# Patient Record
Sex: Female | Born: 1970 | Race: White | Hispanic: No | Marital: Married
Health system: Southern US, Community
[De-identification: ages and names within clinical notes are randomized; demographics above are authoritative.]

---

## 2004-08-21 ENCOUNTER — Ambulatory Visit: Payer: Self-pay | Admitting: Family Medicine

## 2004-10-10 ENCOUNTER — Ambulatory Visit: Payer: Self-pay | Admitting: Family Medicine

## 2004-11-18 ENCOUNTER — Ambulatory Visit: Payer: Self-pay | Admitting: Family Medicine

## 2004-11-26 ENCOUNTER — Encounter: Admission: RE | Admit: 2004-11-26 | Discharge: 2005-01-30 | Payer: Self-pay | Admitting: Family Medicine

## 2005-05-08 ENCOUNTER — Ambulatory Visit: Payer: Self-pay | Admitting: Family Medicine

## 2005-05-13 ENCOUNTER — Ambulatory Visit: Payer: Self-pay | Admitting: Family Medicine

## 2005-08-04 ENCOUNTER — Ambulatory Visit: Payer: Self-pay | Admitting: Family Medicine

## 2006-09-14 ENCOUNTER — Ambulatory Visit: Payer: Self-pay | Admitting: Family Medicine

## 2010-06-18 ENCOUNTER — Other Ambulatory Visit (HOSPITAL_COMMUNITY): Payer: Self-pay | Admitting: Physician Assistant

## 2010-06-18 ENCOUNTER — Other Ambulatory Visit (HOSPITAL_COMMUNITY): Payer: Self-pay | Admitting: Obstetrics and Gynecology

## 2010-06-18 DIAGNOSIS — O09519 Supervision of elderly primigravida, unspecified trimester: Secondary | ICD-10-CM

## 2010-06-18 DIAGNOSIS — O24919 Unspecified diabetes mellitus in pregnancy, unspecified trimester: Secondary | ICD-10-CM

## 2010-06-25 ENCOUNTER — Ambulatory Visit (HOSPITAL_COMMUNITY)
Admission: RE | Admit: 2010-06-25 | Discharge: 2010-06-25 | Disposition: A | Discharge status: Home / Self Care | Payer: Medicaid Other | Source: Ambulatory Visit | Attending: Obstetrics and Gynecology | Admitting: Obstetrics and Gynecology

## 2010-06-25 ENCOUNTER — Ambulatory Visit (HOSPITAL_COMMUNITY): Payer: Medicaid Other

## 2010-06-25 ENCOUNTER — Other Ambulatory Visit (HOSPITAL_COMMUNITY): Payer: Self-pay | Admitting: Physician Assistant

## 2010-06-25 DIAGNOSIS — O24919 Unspecified diabetes mellitus in pregnancy, unspecified trimester: Secondary | ICD-10-CM

## 2010-06-25 DIAGNOSIS — O09519 Supervision of elderly primigravida, unspecified trimester: Secondary | ICD-10-CM

## 2010-06-25 DIAGNOSIS — O09529 Supervision of elderly multigravida, unspecified trimester: Secondary | ICD-10-CM | POA: Insufficient documentation

## 2010-07-23 ENCOUNTER — Ambulatory Visit (HOSPITAL_COMMUNITY)
Admission: RE | Admit: 2010-07-23 | Discharge: 2010-07-23 | Disposition: A | Discharge status: Home / Self Care | Payer: Medicaid Other | Source: Ambulatory Visit | Attending: Obstetrics and Gynecology | Admitting: Obstetrics and Gynecology

## 2010-07-23 ENCOUNTER — Other Ambulatory Visit (HOSPITAL_COMMUNITY): Payer: Self-pay | Admitting: Physician Assistant

## 2010-07-23 DIAGNOSIS — O24919 Unspecified diabetes mellitus in pregnancy, unspecified trimester: Secondary | ICD-10-CM

## 2010-07-23 DIAGNOSIS — O09519 Supervision of elderly primigravida, unspecified trimester: Secondary | ICD-10-CM

## 2010-07-23 DIAGNOSIS — O09529 Supervision of elderly multigravida, unspecified trimester: Secondary | ICD-10-CM | POA: Insufficient documentation

## 2010-07-23 DIAGNOSIS — O34219 Maternal care for unspecified type scar from previous cesarean delivery: Secondary | ICD-10-CM | POA: Insufficient documentation

## 2010-08-23 ENCOUNTER — Other Ambulatory Visit (HOSPITAL_COMMUNITY): Payer: Self-pay | Admitting: Obstetrics and Gynecology

## 2010-08-23 ENCOUNTER — Ambulatory Visit (HOSPITAL_COMMUNITY)
Admission: RE | Admit: 2010-08-23 | Discharge: 2010-08-23 | Disposition: A | Discharge status: Home / Self Care | Payer: Medicaid Other | Source: Ambulatory Visit | Attending: Obstetrics and Gynecology | Admitting: Obstetrics and Gynecology

## 2010-08-23 DIAGNOSIS — O09529 Supervision of elderly multigravida, unspecified trimester: Secondary | ICD-10-CM | POA: Insufficient documentation

## 2010-08-23 DIAGNOSIS — O24919 Unspecified diabetes mellitus in pregnancy, unspecified trimester: Secondary | ICD-10-CM | POA: Insufficient documentation

## 2010-08-23 DIAGNOSIS — O09519 Supervision of elderly primigravida, unspecified trimester: Secondary | ICD-10-CM

## 2010-08-23 DIAGNOSIS — O34219 Maternal care for unspecified type scar from previous cesarean delivery: Secondary | ICD-10-CM | POA: Insufficient documentation

## 2010-09-23 ENCOUNTER — Ambulatory Visit (HOSPITAL_COMMUNITY): Payer: Medicaid Other

## 2010-09-27 ENCOUNTER — Ambulatory Visit (HOSPITAL_COMMUNITY)
Admission: RE | Admit: 2010-09-27 | Discharge: 2010-09-27 | Disposition: A | Discharge status: Home / Self Care | Payer: Medicaid Other | Source: Ambulatory Visit | Attending: Obstetrics and Gynecology | Admitting: Obstetrics and Gynecology

## 2010-09-27 ENCOUNTER — Other Ambulatory Visit (HOSPITAL_COMMUNITY): Payer: Self-pay | Admitting: Obstetrics and Gynecology

## 2010-09-27 DIAGNOSIS — O24919 Unspecified diabetes mellitus in pregnancy, unspecified trimester: Secondary | ICD-10-CM | POA: Insufficient documentation

## 2010-09-27 DIAGNOSIS — O09529 Supervision of elderly multigravida, unspecified trimester: Secondary | ICD-10-CM | POA: Insufficient documentation

## 2010-09-27 DIAGNOSIS — O34219 Maternal care for unspecified type scar from previous cesarean delivery: Secondary | ICD-10-CM | POA: Insufficient documentation

## 2010-09-30 ENCOUNTER — Other Ambulatory Visit (HOSPITAL_COMMUNITY): Payer: Self-pay | Admitting: Obstetrics and Gynecology

## 2010-09-30 DIAGNOSIS — O09529 Supervision of elderly multigravida, unspecified trimester: Secondary | ICD-10-CM

## 2010-10-25 ENCOUNTER — Ambulatory Visit (HOSPITAL_COMMUNITY)
Admission: RE | Admit: 2010-10-25 | Discharge: 2010-10-25 | Disposition: A | Discharge status: Home / Self Care | Payer: Medicaid Other | Source: Ambulatory Visit | Attending: Obstetrics and Gynecology | Admitting: Obstetrics and Gynecology

## 2010-10-25 DIAGNOSIS — O34219 Maternal care for unspecified type scar from previous cesarean delivery: Secondary | ICD-10-CM | POA: Insufficient documentation

## 2010-10-25 DIAGNOSIS — O24919 Unspecified diabetes mellitus in pregnancy, unspecified trimester: Secondary | ICD-10-CM | POA: Insufficient documentation

## 2010-10-25 DIAGNOSIS — O09529 Supervision of elderly multigravida, unspecified trimester: Secondary | ICD-10-CM | POA: Insufficient documentation

## 2010-10-25 NOTE — Progress Notes (Signed)
Report in [facility]; follow-up as needed 

## 2013-03-03 IMAGING — US US OB FOLLOW-UP
1 series · 14 of 28 positions shown · non-contrast
Comparison: none

[Series 1: us ob follow-up · 0.21mm/px · 14 of 50 slices shown]
[im 2/50]
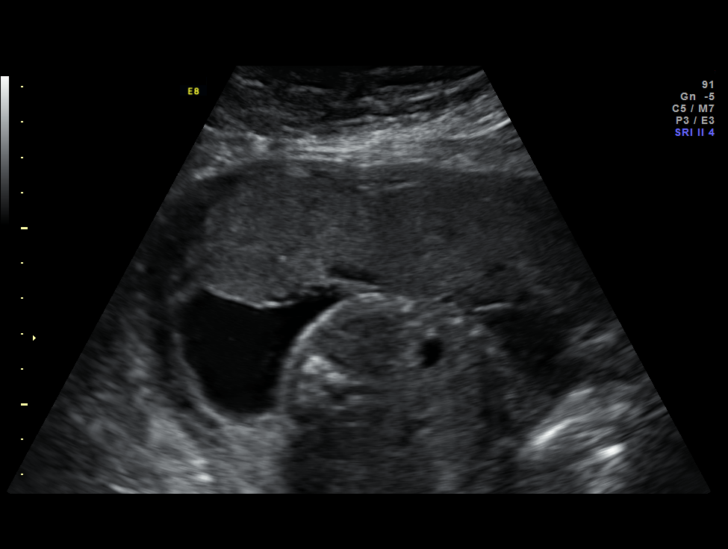
[im 6/50]
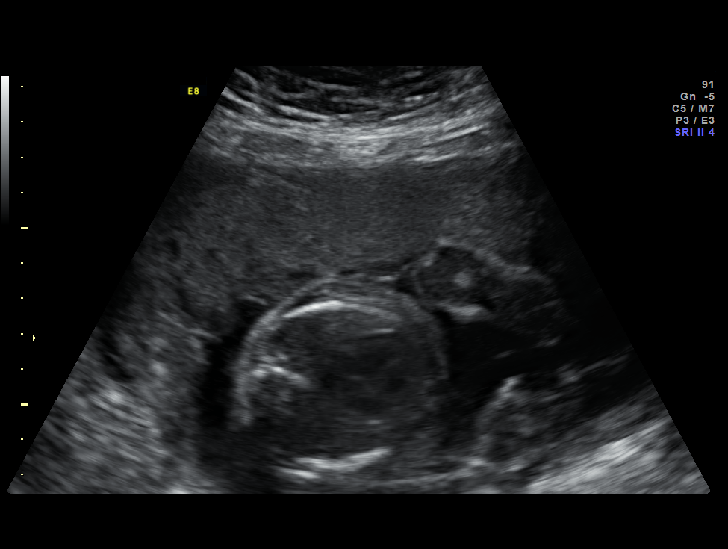
[im 10/50]
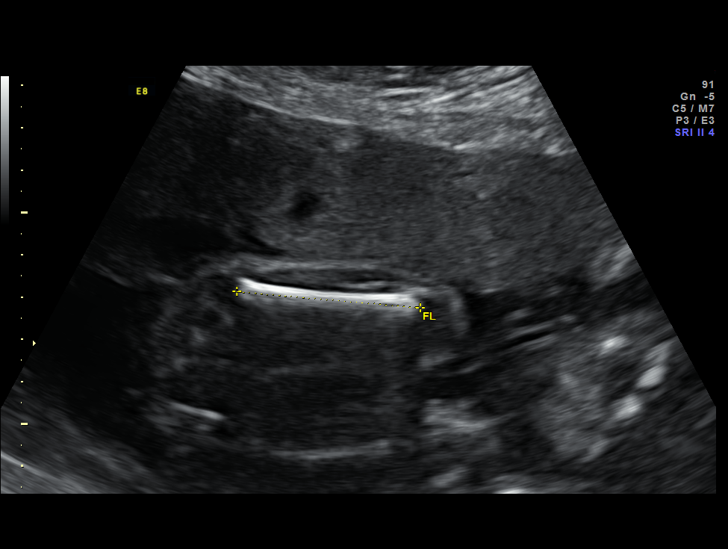
[im 13/50]
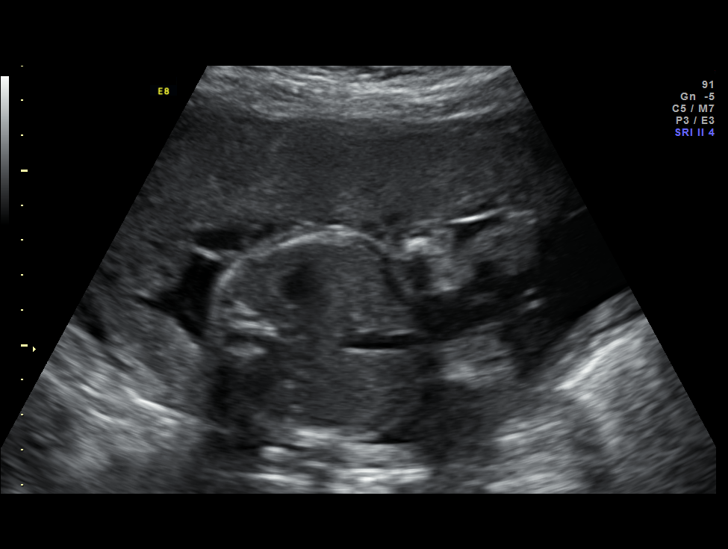
[im 17/50]
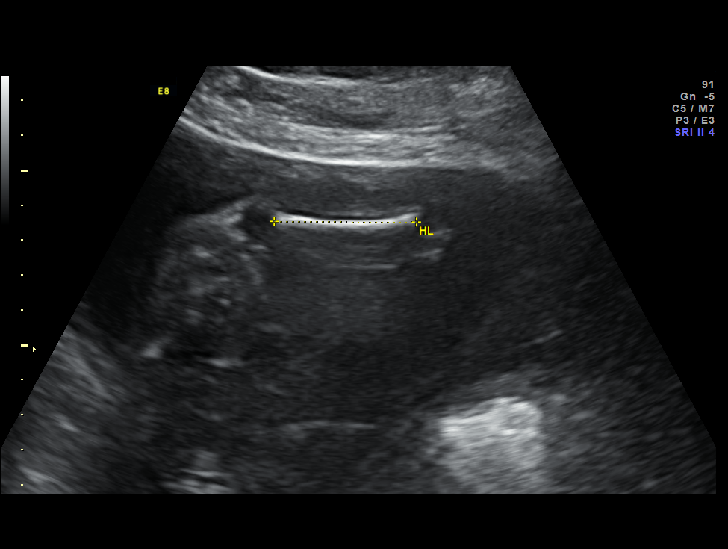
[im 20/50]
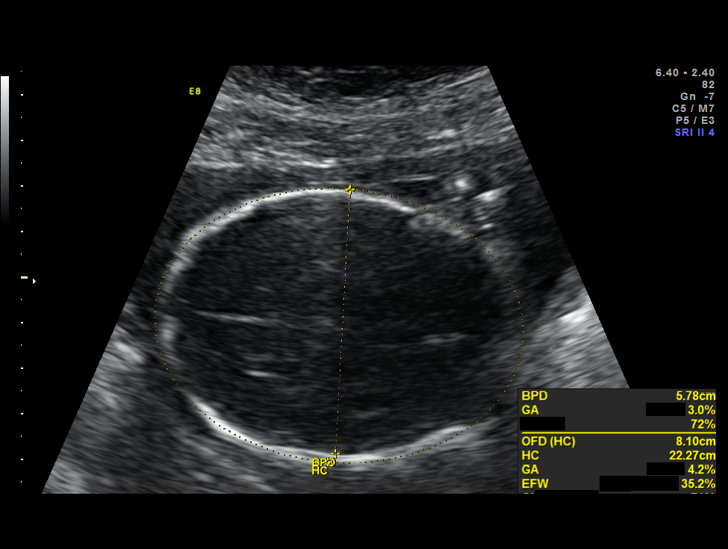
[im 24/50]
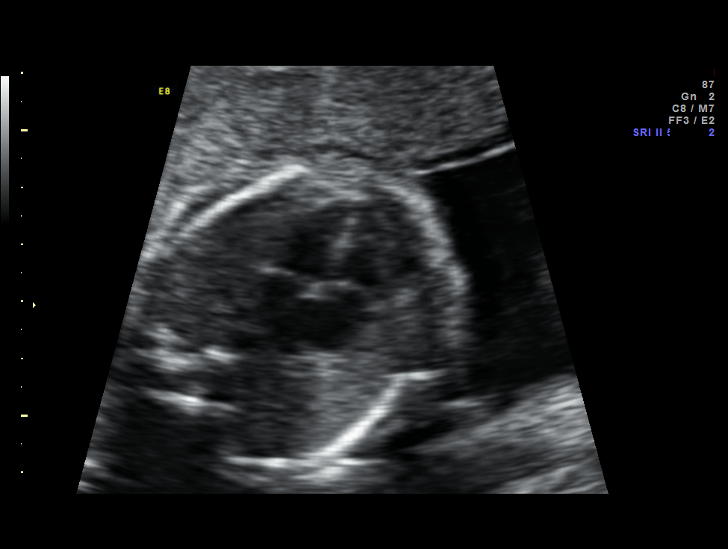
[im 28/50]
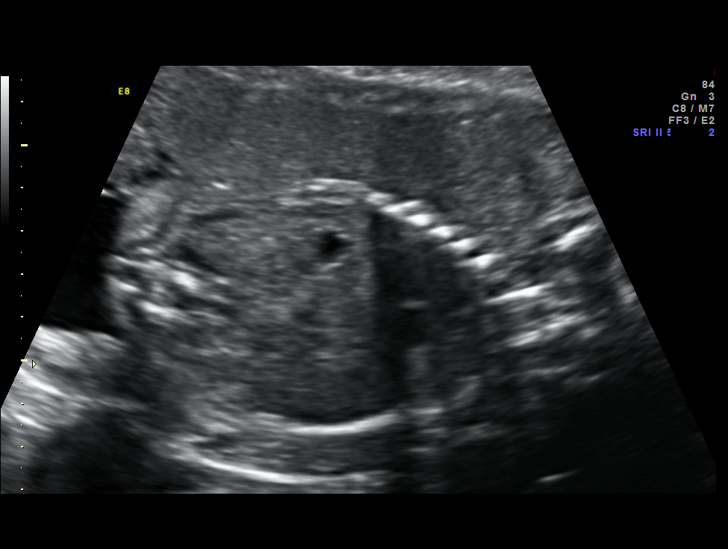
[im 31/50]
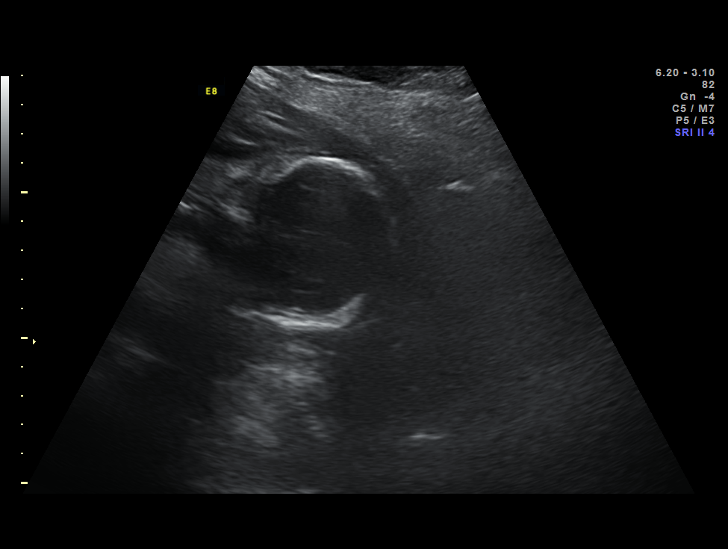
[im 35/50]
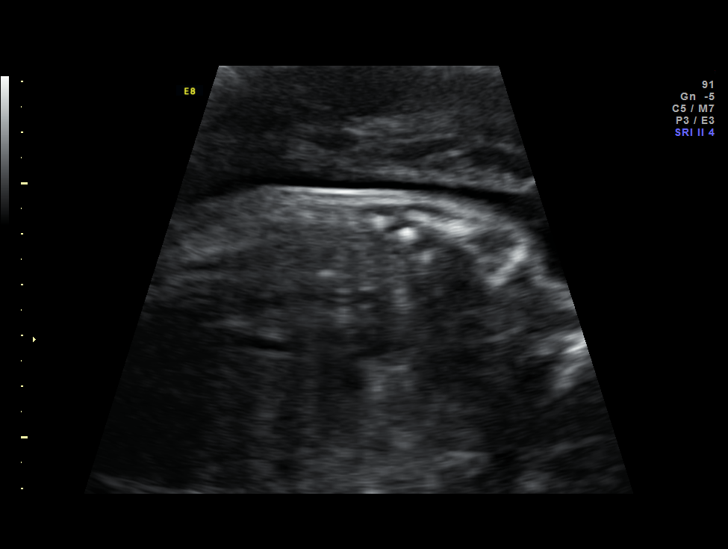
[im 39/50]
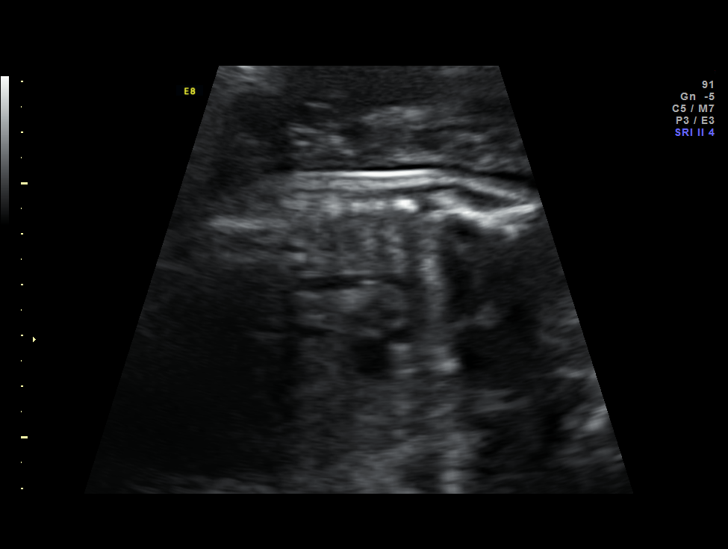
[im 42/50]
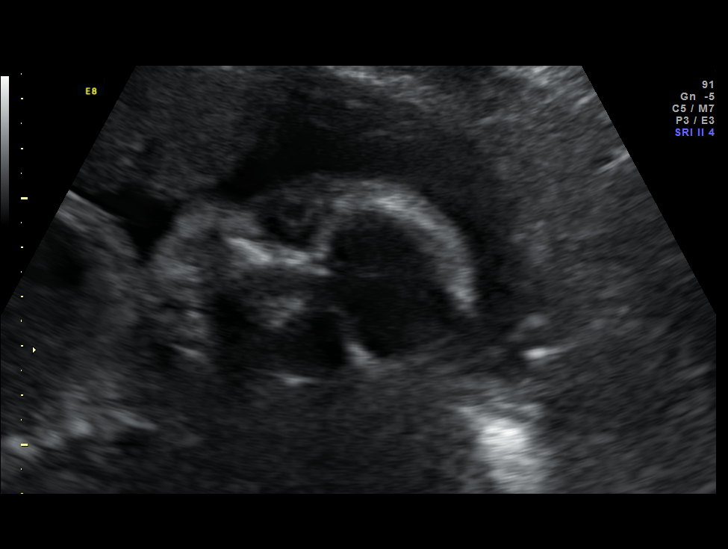
[im 46/50]
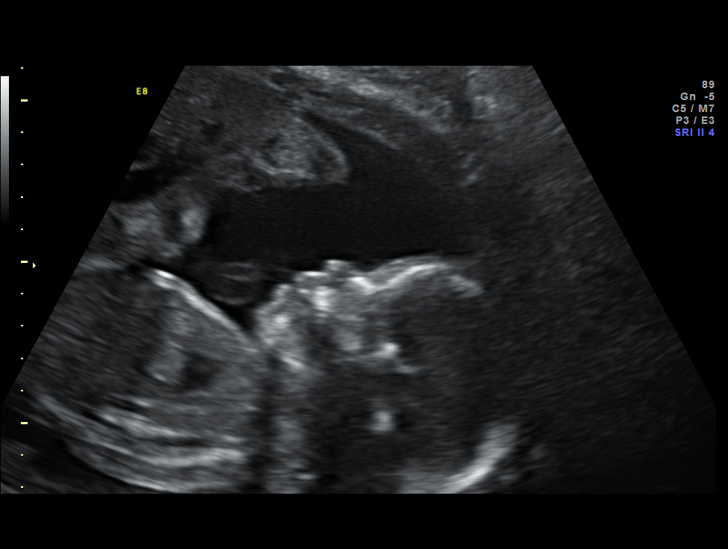
[im 50/50]
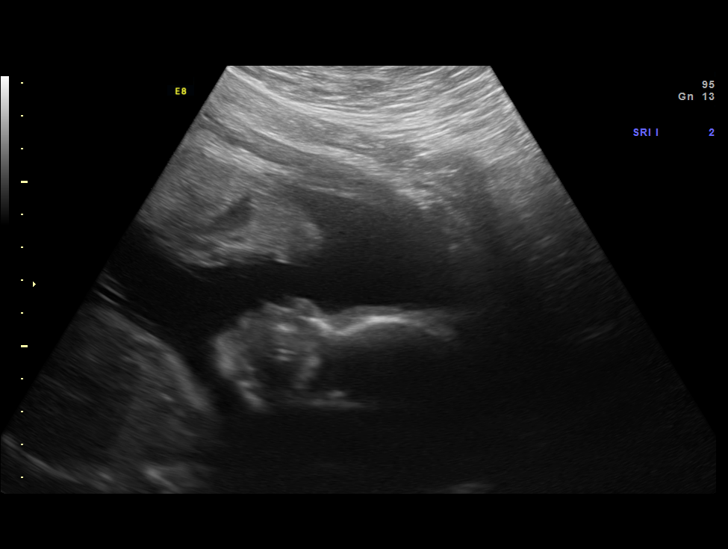

[14 of 28 positions shown; findings below may reference images not displayed]

Canned report from images found in remote index.

Refer to host system for actual result text.

## 2013-05-05 IMAGING — US US OB FOLLOW-UP
1 series · 14 of 28 positions shown · non-contrast
Comparison: none

[Series 1: us ob follow-up · 0.25mm/px · 14 of 30 slices shown]
[im 2/30]
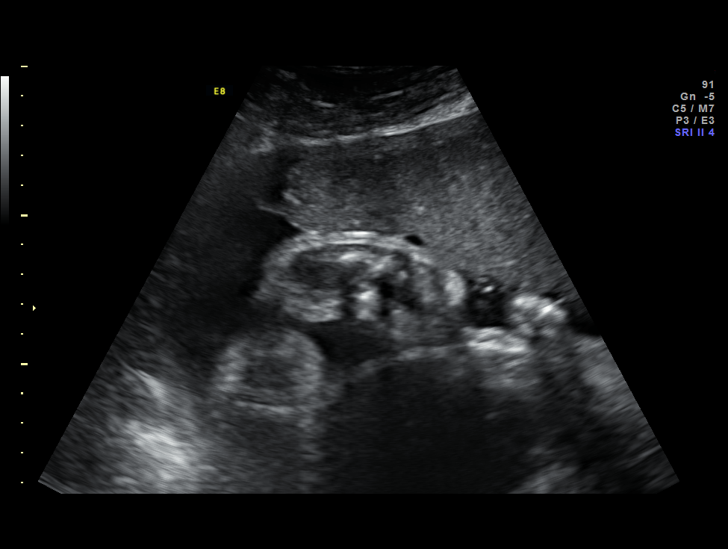
[im 4/30]
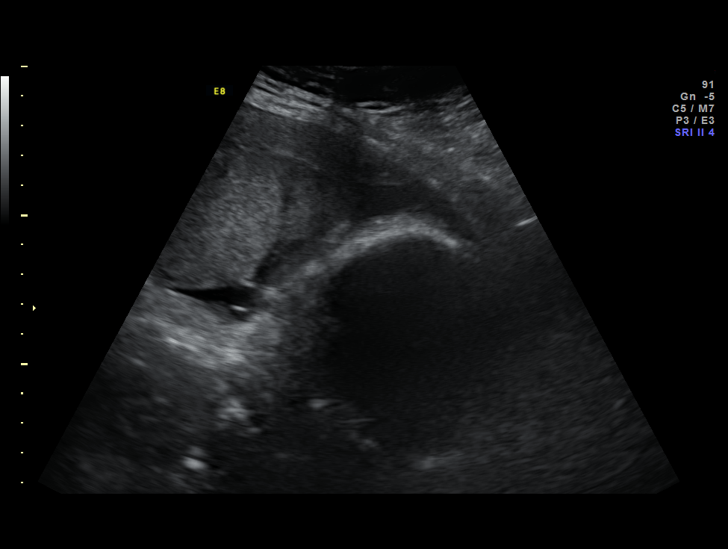
[im 6/30]
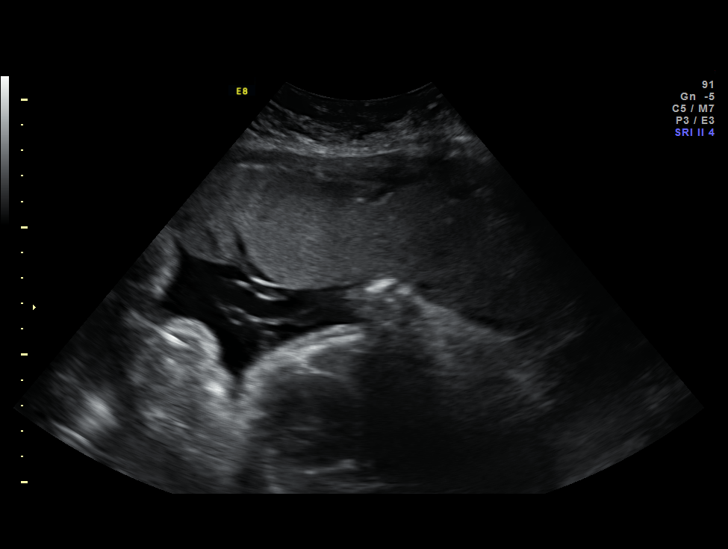
[im 8/30]
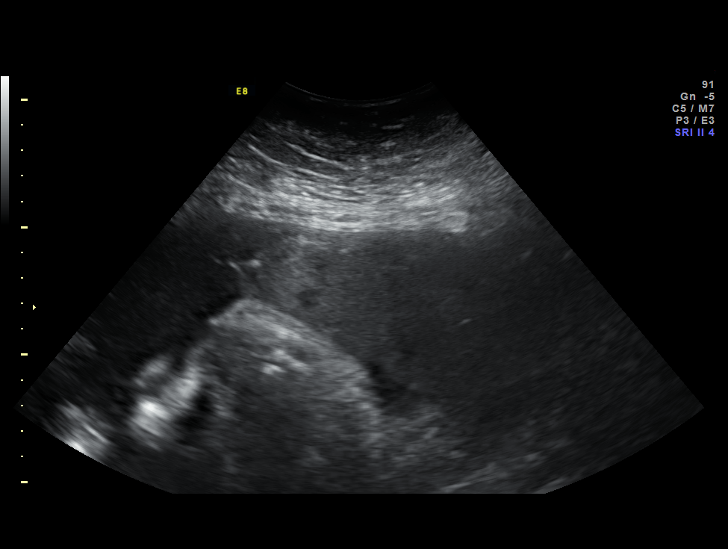
[im 10/30]
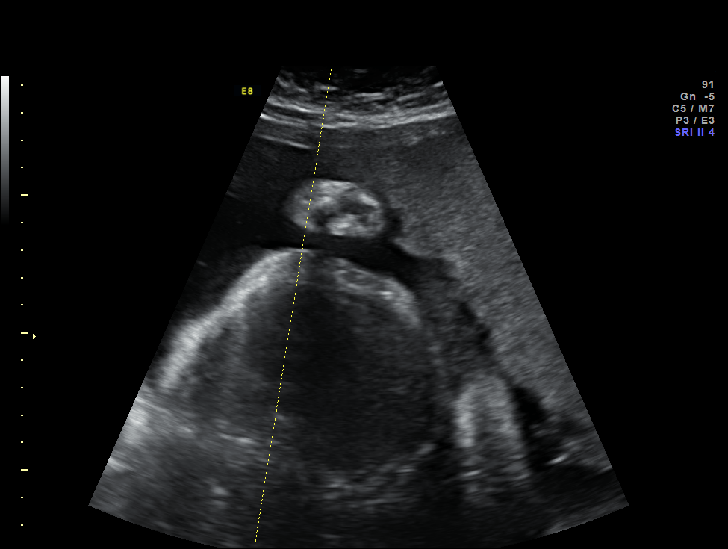
[im 12/30]
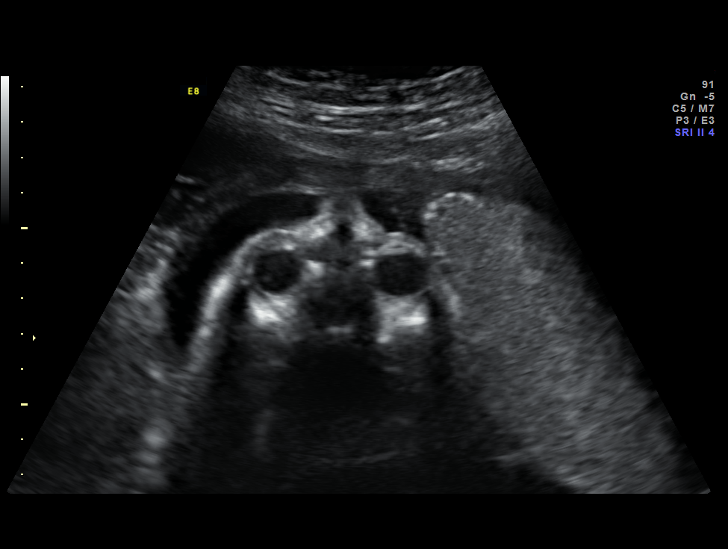
[im 14/30]
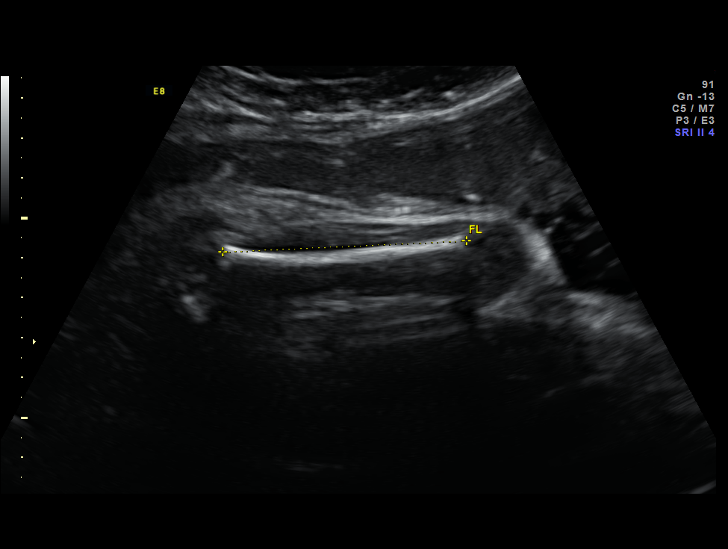
[im 17/30]
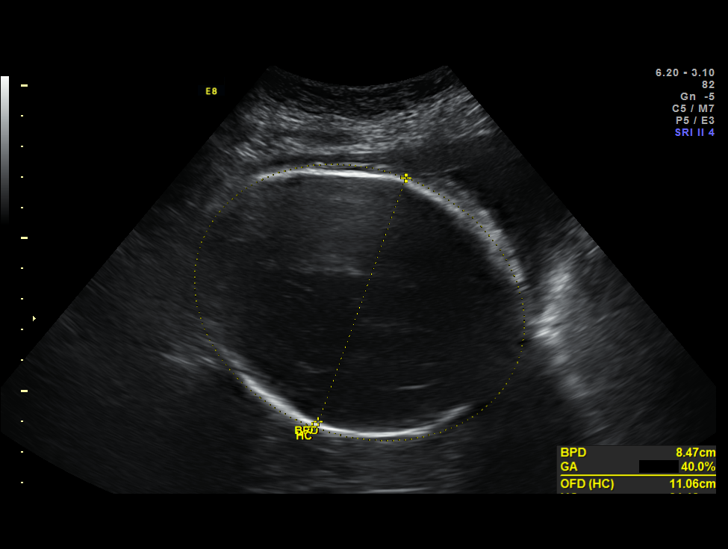
[im 19/30]
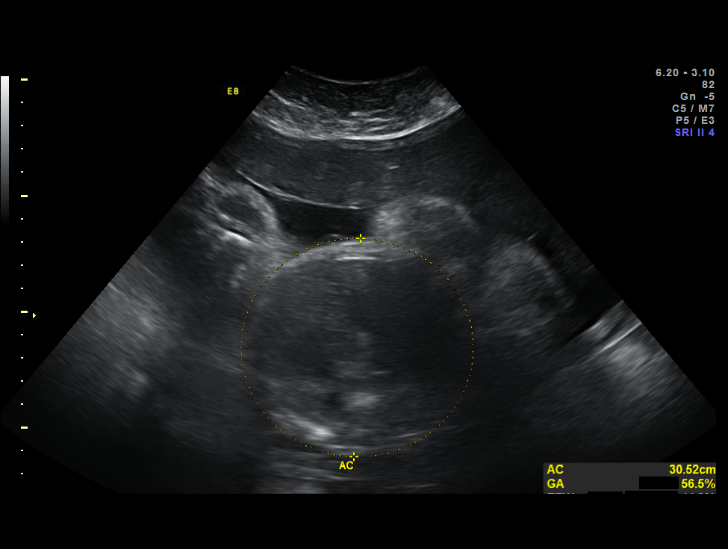
[im 21/30]
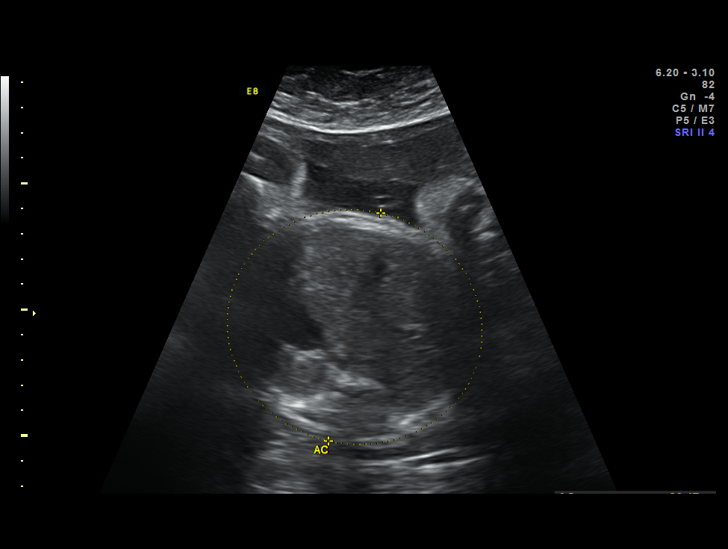
[im 23/30]
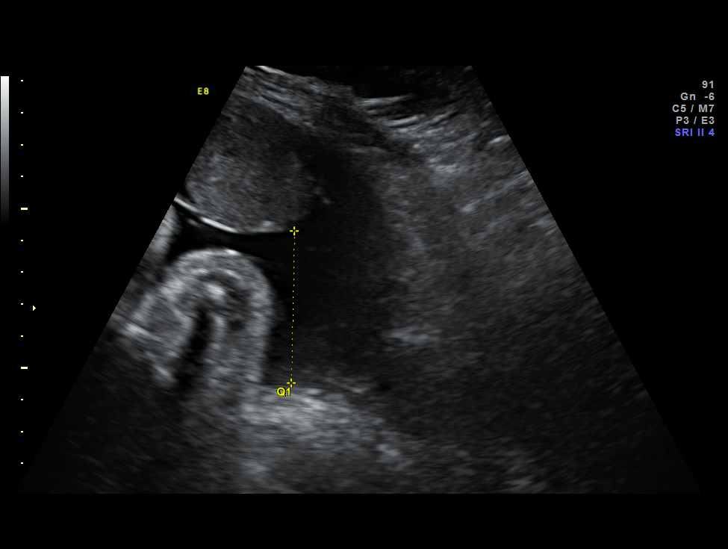
[im 25/30]
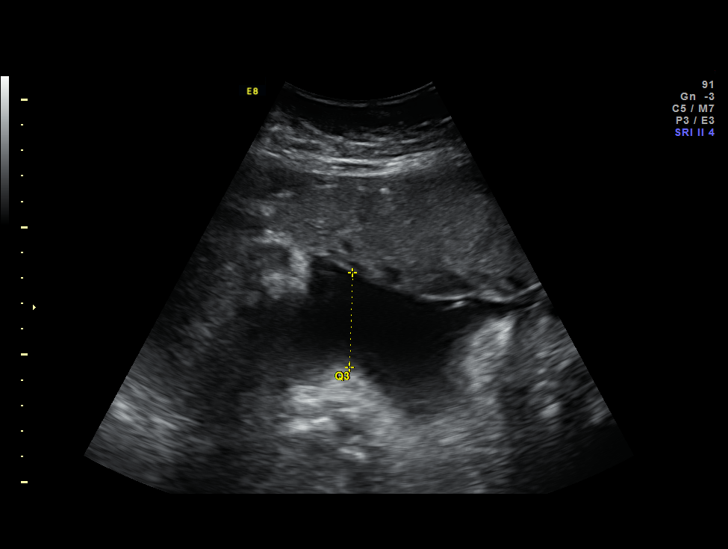
[im 27/30]
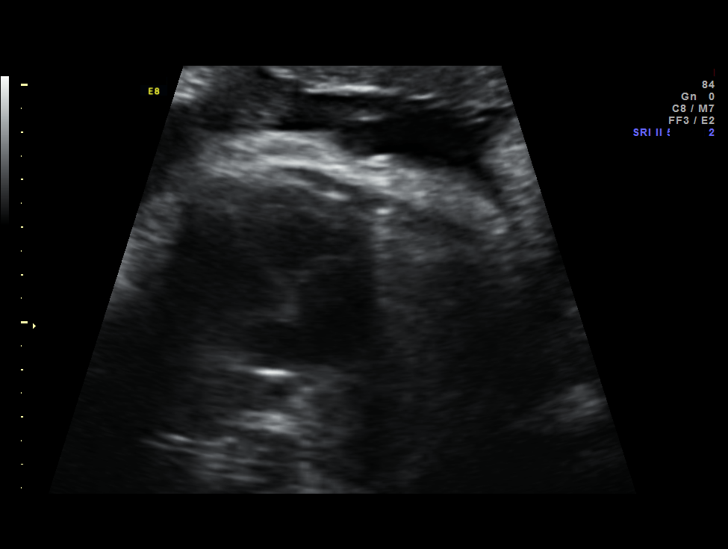
[im 30/30]
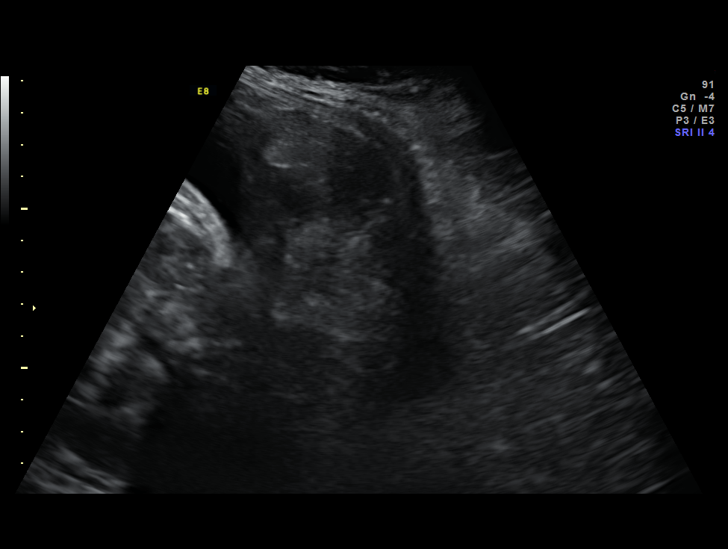

[14 of 28 positions shown; findings below may reference images not displayed]

Canned report from images found in remote index.

Refer to host system for actual result text.

## 2018-01-28 ENCOUNTER — Encounter: Payer: Self-pay | Admitting: Internal Medicine

## 2018-02-10 ENCOUNTER — Encounter: Payer: Self-pay | Admitting: Infectious Diseases

## 2018-04-29 ENCOUNTER — Ambulatory Visit: Payer: Self-pay | Admitting: Gastroenterology

## 2018-04-29 ENCOUNTER — Encounter: Payer: Self-pay | Admitting: Internal Medicine

## 2018-04-29 ENCOUNTER — Telehealth: Payer: Self-pay | Admitting: Gastroenterology

## 2018-04-29 NOTE — Telephone Encounter (Signed)
NO SHOW AND LETTER SENT  

## 2018-05-31 NOTE — Congregational Nurse Program (Signed)
  Dept: [phone]   Congregational Nurse Program Note  Date of Encounter: 05/31/2018  Past Medical History: No past medical history on file.  Encounter Details: CNP Questionnaire - 05/31/18 1628      Questionnaire   Patient Status  Not Applicable    Race  Black or African American    Location Patient Served At  [employer], [employer]    Uninsured  Not [employer]  No food insecurities    Housing/Utilities  Yes, have permanent housing    Transportation  No transportation needs    Interpersonal Safety  Yes, feel physically and emotionally safe where you currently live    Medication  No medication insecurities    Medical Provider  No    Referrals  Not Applicable    ED Visit Averted  Not Applicable    Life-Saving Intervention Made  Not Applicable     No complaints or concerns  BP 103/72 P 01 [address] [employer], [phone]

## 2018-06-17 NOTE — Congregational Nurse Program (Signed)
Seen at the [facility]. Stated she is a diabetic on oral medication and has been  a little dizzy today  .  B P 103/72 P 91 Suggested she be seen at the ED but stated she has an appointment next week.. Discussed the importance of eating the proper diet low in concentrated  Sweets and carbohydrates, getting exercise at least 3-5 days per week, and getting proper rest .Stat.ed she had eaten a lot of carbs for breakfast  Random Blood Glucose 323  Had not taken her medication.discussed the need to take prescribed medications daily. Voiced understanding Jenene Slicker RN, [facility], [phone]
